# Patient Record
Sex: Male | Born: 1995 | Race: White | Hispanic: No | Marital: Single
Health system: Southern US, Community
[De-identification: ages and names within clinical notes are randomized; demographics above are authoritative.]

---
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
## 2022-05-16 ENCOUNTER — Emergency Department
Admission: EM | Admit: 2022-05-16 | Discharge: 2022-05-16 | Disposition: A | Discharge status: Home / Self Care | Payer: Self-pay | Attending: Emergency Medicine | Admitting: Emergency Medicine

## 2022-05-16 DIAGNOSIS — S61212A Laceration without foreign body of right middle finger without damage to nail, initial encounter: Secondary | ICD-10-CM | POA: Insufficient documentation

## 2022-05-16 DIAGNOSIS — Z23 Encounter for immunization: Secondary | ICD-10-CM | POA: Insufficient documentation

## 2022-05-16 DIAGNOSIS — W260XXA Contact with knife, initial encounter: Secondary | ICD-10-CM | POA: Insufficient documentation

## 2022-05-16 MED ORDER — TETANUS-DIPHTH-ACELL PERTUSSIS 5-2.5-18.5 LF-MCG/0.5 IM SUSY
0.5000 mL | PREFILLED_SYRINGE | Freq: Once | INTRAMUSCULAR | Status: AC
  Administered 2022-05-16: 0.5 mL via INTRAMUSCULAR
  Filled 2022-05-16: qty 0.5

## 2022-05-16 NOTE — ED Provider Notes (Signed)
[facility] Provider Note  Patient Contact: 8:19 PM (approximate)   History   No chief complaint on file.   HPI  Walter Hickman is a 26 y.o. male who presents the emergency department complaining of a laceration to the middle finger of the right hand.  Patient was using a knife to cut some plastic when he lost his balance.  Patient states that the knife blade made contact with his right middle finger.  He has a laceration over the PIP joint.  No active bleeding.  Patient states that he gets very queasy at the site of blood, did have a brief syncopal episode after seeing the blood.  He did not sustain any other injuries during this episode.  This is very typical for the patient.  Patient denies any other complaints.  Unsure last tetanus shot.     Physical Exam   Triage Vital Signs: ED Triage Vitals  Enc Vitals Group     BP 05/16/22 1907 134/68     Pulse Rate 05/16/22 1907 100     Resp 05/16/22 1907 16     Temp 05/16/22 1907 98 F (36.7 C)     Temp Source 05/16/22 1907 Oral     SpO2 05/16/22 1907 95 %     Weight --      Height --      Head Circumference --      Peak Flow --      Pain Score 05/16/22 1908 0     Pain Loc --      Pain Edu? --      Excl. in GC? --     Most recent vital signs: Vitals:   05/16/22 1907  BP: 134/68  Pulse: 100  Resp: 16  Temp: 98 F (36.7 C)  SpO2: 95%     General: Alert and in no acute distress.  Cardiovascular:  Good peripheral perfusion Respiratory: Normal respiratory effort without tachypnea or retractions. Lungs CTAB.  Musculoskeletal: Full range of motion to all extremities.  Visualization of the right hand reveals a laceration over the PIP joint.  Edges are well approximated.  Measures approximately 1.5 cm in length.  No evidence of foreign body.  Full range of motion to the digit.  Sensation and capillary refill intact to the digit. Neurologic:  No gross focal neurologic deficits are appreciated.  Skin:   No  rash noted Other:   ED Results / Procedures / Treatments   Labs (all labs ordered are listed, but only abnormal results are displayed) Labs Reviewed - No data to display   EKG     RADIOLOGY    No results found.  PROCEDURES:  Critical Care performed: No  ..Laceration Repair  Date/Time: 05/16/2022 8:22 PM  Performed by: Racheal Patches, PA-C Authorized by: Racheal Patches, PA-C   Consent:    Consent obtained:  Verbal   Consent given by:  Patient   Risks discussed:  Infection and pain Universal protocol:    Procedure explained and questions answered to patient or proxy's satisfaction: yes     Immediately prior to procedure, a time out was called: yes     Patient identity confirmed:  Verbally with patient Anesthesia:    Anesthesia method:  None Laceration details:    Location:  Finger   Finger location:  R long finger   Length (cm):  1.5 Exploration:    Hemostasis achieved with:  Direct pressure   Imaging outcome: foreign body not noted  Wound exploration: entire depth of wound visualized     Wound extent: no foreign bodies/material noted, no nerve damage noted, no tendon damage noted and no underlying fracture noted     Contaminated: no   Treatment:    Area cleansed with:  Shur-Clens   Amount of cleaning:  Standard Skin repair:    Repair method:  Tissue adhesive Approximation:    Approximation:  Close Repair type:    Repair type:  Simple Post-procedure details:    Dressing:  Splint for protection   Procedure completion:  Tolerated well, no immediate complications    MEDICATIONS ORDERED IN ED: Medications  Tdap (BOOSTRIX) injection 0.5 mL (has no administration in time range)     IMPRESSION / MDM / ASSESSMENT AND PLAN / ED COURSE  I reviewed the triage vital signs and the nursing notes.                              Differential diagnosis includes, but is not limited to, finger serration, tendon laceration, retained foreign  body  Patient's presentation is most consistent with acute illness / injury with system symptoms.   Patient's diagnosis is consistent with finger laceration.  Patient presents to the ED after lacerating the right middle finger.  Patient had a relatively superficial laceration along the dorsal right finger over the PIP joint.  Full range of motion is preserved with no indication of tendon injury.  Patient has no active bleeding.  Unsure last tetanus shot and this was updated tonight.  Area was cleansed, closed using Dermabond.  Splint for protection over the next 2 to 3 days in which case patient may then take off the splint at home.  No indication for antibiotics currently.  Return precautions discussed with the patient.  Follow-up primary care as needed..  Patient is given ED precautions to return to the ED for any worsening or new symptoms.        FINAL CLINICAL IMPRESSION(S) / ED DIAGNOSES   Final diagnoses:  Laceration of right middle finger without foreign body without damage to nail, initial encounter     Rx / DC Orders   ED Discharge Orders     None        Note:  This document was prepared using Dragon voice recognition software and may include unintentional dictation errors.   Lanette Hampshire 05/16/22 2024    Gilles Chiquito, MD 05/16/22 2109

## 2022-05-16 NOTE — ED Triage Notes (Signed)
Pt arrives with c/o right middle finger lac that happened earlier today.
